# Patient Record
Sex: Female | Born: 1975 | Hispanic: Yes | Marital: Married | Smoking: Never smoker
Health system: Southern US, Community
[De-identification: ages and names within clinical notes are randomized; demographics above are authoritative.]

## PROBLEM LIST (undated history)

## (undated) DIAGNOSIS — R739 Hyperglycemia, unspecified: Secondary | ICD-10-CM

## (undated) DIAGNOSIS — E785 Hyperlipidemia, unspecified: Secondary | ICD-10-CM

## (undated) HISTORY — DX: Hyperlipidemia, unspecified: E78.5

## (undated) HISTORY — DX: Hyperglycemia, unspecified: R73.9

---

## 1999-11-19 HISTORY — PX: TUBAL LIGATION: SHX77

## 2015-11-19 DIAGNOSIS — E785 Hyperlipidemia, unspecified: Secondary | ICD-10-CM

## 2015-11-19 HISTORY — DX: Hyperlipidemia, unspecified: E78.5

## 2017-11-18 DIAGNOSIS — R739 Hyperglycemia, unspecified: Secondary | ICD-10-CM

## 2017-11-18 HISTORY — DX: Hyperglycemia, unspecified: R73.9

## 2018-03-27 ENCOUNTER — Ambulatory Visit: Payer: Self-pay | Admitting: Internal Medicine

## 2018-05-11 ENCOUNTER — Ambulatory Visit: Payer: Self-pay | Admitting: Internal Medicine

## 2018-05-11 ENCOUNTER — Encounter: Payer: Self-pay | Admitting: Internal Medicine

## 2018-05-11 VITALS — BP 122/84 | HR 68 | Resp 12 | Ht 60.0 in | Wt 136.0 lb

## 2018-05-11 DIAGNOSIS — R631 Polydipsia: Secondary | ICD-10-CM

## 2018-05-11 DIAGNOSIS — G5603 Carpal tunnel syndrome, bilateral upper limbs: Secondary | ICD-10-CM

## 2018-05-11 DIAGNOSIS — R358 Other polyuria: Secondary | ICD-10-CM

## 2018-05-11 DIAGNOSIS — E785 Hyperlipidemia, unspecified: Secondary | ICD-10-CM

## 2018-05-11 DIAGNOSIS — R739 Hyperglycemia, unspecified: Secondary | ICD-10-CM

## 2018-05-11 DIAGNOSIS — R3589 Other polyuria: Secondary | ICD-10-CM

## 2018-05-11 LAB — POCT URINALYSIS DIPSTICK
Bilirubin, UA: NEGATIVE
Glucose, UA: NEGATIVE
KETONES UA: NEGATIVE
LEUKOCYTES UA: NEGATIVE
NITRITE UA: POSITIVE
PH UA: 6.5 (ref 5.0–8.0)
PROTEIN UA: POSITIVE — AB
Spec Grav, UA: 1.015 (ref 1.010–1.025)
UROBILINOGEN UA: 0.2 U/dL

## 2018-05-11 LAB — GLUCOSE, POCT (MANUAL RESULT ENTRY): POC GLUCOSE: 117 mg/dL — AB (ref 70–99)

## 2018-05-11 MED ORDER — CIPROFLOXACIN HCL 500 MG PO TABS: ORAL_TABLET | ORAL | 0 refills | Status: DC

## 2018-05-11 NOTE — Patient Instructions (Signed)
Cock up splints for both hands to be worn every night with sleep. Ibuprofen 200 mg 2-3 pastillas dos veces al dia con comida para 14 days.

## 2018-05-11 NOTE — Progress Notes (Signed)
Subjective:    Patient ID: Virginia Hammond, female    DOB: 10-30-1976, 42 y.o.   MRN: [id number]  HPI   Here to establish  1.  Finger numbness:  Does wake her from sleep.  Starts on the thumb side and then moves toward ulnar side of hand with finger involvement.  States this has been going on for 3 months.  Generally occurs 3 times weekly.  Lasts about 1 hour, but does state she can shake her hands out and will temporarily resolve. 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  Does describe repetitive wrist movement with her job.   Also, notes this when brushing or doing her hair.   At times the discomfort and numbness seems to start in her lateral shoulders and radiates down to hands.   She is right handed.   Denies associated neck and trap area pain or symptoms.   She does sometimes have a headache surrounding her left eye, but this is not all the time.  2. For past 2 weeks, gets really thirsty and saliva gets really thick and sticky and her mouth is dry.  She feels she has to urinate even if she does not drink much water. She has to get up 5 times to urinate at night.  Has to get up every 10 minutes to urinate until about 3 a.m and then the feeling resolves and she is able to sleep the rest of the night/morning. No burning on urination. No hematuria. No fever. Her maternal grandfather has a history of DM.   No history of GDM or elevated blood sugars in the past. She believes she is drinking 11 eight ounce bottles of water. She has gained some weight in recent months.  Sugar here was 117 fingerstick just after 2 pm.  Last meal was 11:30 a.m.  No outpatient medications have been marked as taking for the 05/11/18 encounter (Office Visit) with Julieanne MansonMulberry, Flavio Lindroth, MD.    No Known Allergies   Past Medical History:  Diagnosis Date  . [place]   Past Surgical History:  Procedure  Laterality Date  . CESAREAN SECTION  1994, 1995. 2001   First was for a twin birth  . TUBAL LIGATION  2001   with last C section   Family History  Problem Relation Age of Onset  . Muscular dystrophy Brother        Donalee CitrinBecker MD  . Other Brother        cystercercosis  . Muscular dystrophy Brother        Donalee CitrinBecker MD  . Muscular dystrophy Brother        Donalee CitrinBecker MD   Social History   Socioeconomic History  . Marital status: Married    Spouse name: Garnette ScheuermannSalvador Cruz  . Number of children: 4  . Years of education: 2012  . Highest education level: Not on file  Occupational History  . Not on file  Social Needs  . Financial resource strain: Not on file  . Food insecurity:    Worry: Not on file    Inability: Not on file  . Transportation needs:    Medical: Not on file    Non-medical: Not on file  Tobacco Use  . Smoking status: Never Smoker  . Smokeless tobacco: Never Used  Substance and Sexual Activity  . Alcohol use: Never    Frequency: Never  . Drug use: Never  .  Sexual activity: Yes  Lifestyle  . Physical activity:    Days per week: Not on file    Minutes per session: Not on file  . Stress: Not on file  Relationships  . Social connections:    Talks on phone: Not on file    Gets together: Not on file    Attends religious service: Not on file    Active member of club or organization: Not on file    Attends meetings of clubs or organizations: Not on file    Relationship status: Not on file  . Intimate partner violence:    Fear of current or ex partner: Not on file    Emotionally abused: Not on file    Physically abused: Not on file    Forced sexual activity: Not on file  Other Topics Concern  . Not on file  Social History Narrative   [place]   Lives at home with husband, son, oldest daughter and her daughter's husband and their 2 children      Review of Systems     Objective:   Physical Exam  NAD HEENT:  PERRL, EOMI, TMs pearly gray, throat without  injection.  MMM Neck:  Supple, No adenopathy, no thyromegaly Chest:  CTA CV:  RRR with normal S1 and S2, No S3, S4 or murmur.  Radial and DP pulses normal and equal Abd:  S, NT, No HSM or mass, + BS. No flank tenderness  MS:  Hands:  Tender PIPs with bony hypertrophic changes.  Negative Tinels at median nerve, volar wrist, but + Phalens bilaterally with wrist flexion.        Assessment & Plan:  1.  Mild hyperglycemia on fingerstick:  CMP, A1C.    2.  Possible polydipsia/polyuria vs urinary frequency:  + nitrite on UA.  Start Cipro 500 mg twice daily for 7 days.   Urine for culture.  CBC A1C as above to evaluate for DM as cause of urinary complaints as well.  3.  Hyperlipidemia:  Fasting today.  Check FLP  4.  Bilateral Carpal Tunnel Syndrome:  Cock up splints nightly and Ibuprofen twice daily for 2 weeks.  Follow up in 2 months for CPE.

## 2018-05-12 ENCOUNTER — Telehealth: Payer: Self-pay | Admitting: Licensed Clinical Social Worker

## 2018-05-12 LAB — CBC WITH DIFFERENTIAL/PLATELET
BASOS ABS: 0 10*3/uL (ref 0.0–0.2)
Basos: 0 %
EOS (ABSOLUTE): 0.2 10*3/uL (ref 0.0–0.4)
Eos: 4 %
Hematocrit: 41.5 % (ref 34.0–46.6)
Hemoglobin: 13.6 g/dL (ref 11.1–15.9)
IMMATURE GRANS (ABS): 0 10*3/uL (ref 0.0–0.1)
Immature Granulocytes: 0 %
LYMPHS ABS: 1.7 10*3/uL (ref 0.7–3.1)
LYMPHS: 34 %
MCH: 30.8 pg (ref 26.6–33.0)
MCHC: 32.8 g/dL (ref 31.5–35.7)
MCV: 94 fL (ref 79–97)
Monocytes Absolute: 0.4 10*3/uL (ref 0.1–0.9)
Monocytes: 8 %
NEUTROS ABS: 2.8 10*3/uL (ref 1.4–7.0)
Neutrophils: 54 %
PLATELETS: 258 10*3/uL (ref 150–450)
RBC: 4.42 x10E6/uL (ref 3.77–5.28)
RDW: 13.8 % (ref 12.3–15.4)
WBC: 5.2 10*3/uL (ref 3.4–10.8)

## 2018-05-12 LAB — HGB A1C W/O EAG: Hgb A1c MFr Bld: 5.6 % (ref 4.8–5.6)

## 2018-05-12 LAB — COMPREHENSIVE METABOLIC PANEL
ALBUMIN: 4.1 g/dL (ref 3.5–5.5)
ALK PHOS: 58 IU/L (ref 39–117)
ALT: 15 IU/L (ref 0–32)
AST: 17 IU/L (ref 0–40)
Albumin/Globulin Ratio: 1.5 (ref 1.2–2.2)
BUN / CREAT RATIO: 17 (ref 9–23)
BUN: 11 mg/dL (ref 6–24)
CHLORIDE: 104 mmol/L (ref 96–106)
CO2: 24 mmol/L (ref 20–29)
CREATININE: 0.65 mg/dL (ref 0.57–1.00)
Calcium: 8.7 mg/dL (ref 8.7–10.2)
GFR calc Af Amer: 126 mL/min/{1.73_m2} (ref >59)
GFR calc non Af Amer: 109 mL/min/{1.73_m2} (ref >59)
GLUCOSE: 85 mg/dL (ref 65–99)
Globulin, Total: 2.7 g/dL (ref 1.5–4.5)
POTASSIUM: 4.1 mmol/L (ref 3.5–5.2)
SODIUM: 139 mmol/L (ref 134–144)
Total Protein: 6.8 g/dL (ref 6.0–8.5)

## 2018-05-12 NOTE — Telephone Encounter (Signed)
LCSW called patient in order to introduce self and provide information about social work services available at the clinic. Pt reported that she did not have any needs at this time.

## 2018-05-13 LAB — URINE CULTURE

## 2018-05-20 ENCOUNTER — Other Ambulatory Visit: Payer: Self-pay

## 2018-05-20 DIAGNOSIS — R3589 Other polyuria: Secondary | ICD-10-CM

## 2018-05-20 DIAGNOSIS — R358 Other polyuria: Secondary | ICD-10-CM

## 2018-05-20 LAB — POCT URINALYSIS DIPSTICK
BILIRUBIN UA: NEGATIVE
Glucose, UA: NEGATIVE
KETONES UA: NEGATIVE
Leukocytes, UA: NEGATIVE
Nitrite, UA: NEGATIVE
PH UA: 5 (ref 5.0–8.0)
Protein, UA: NEGATIVE
Spec Grav, UA: 1.01 (ref 1.010–1.025)
UROBILINOGEN UA: 0.2 U/dL

## 2018-07-13 ENCOUNTER — Encounter: Payer: Self-pay | Admitting: Internal Medicine

## 2018-07-13 ENCOUNTER — Ambulatory Visit: Payer: Self-pay | Admitting: Internal Medicine

## 2018-07-13 VITALS — BP 128/80 | HR 70 | Resp 12 | Ht 60.0 in | Wt 138.0 lb

## 2018-07-13 DIAGNOSIS — Z0000 Encounter for general adult medical examination without abnormal findings: Secondary | ICD-10-CM

## 2018-07-13 DIAGNOSIS — Z23 Encounter for immunization: Secondary | ICD-10-CM

## 2018-07-13 DIAGNOSIS — Z1231 Encounter for screening mammogram for malignant neoplasm of breast: Secondary | ICD-10-CM

## 2018-07-13 DIAGNOSIS — Z124 Encounter for screening for malignant neoplasm of cervix: Secondary | ICD-10-CM

## 2018-07-13 DIAGNOSIS — Z1239 Encounter for other screening for malignant neoplasm of breast: Secondary | ICD-10-CM

## 2018-07-13 DIAGNOSIS — E785 Hyperlipidemia, unspecified: Secondary | ICD-10-CM

## 2018-07-13 DIAGNOSIS — R739 Hyperglycemia, unspecified: Secondary | ICD-10-CM

## 2018-07-13 DIAGNOSIS — H9192 Unspecified hearing loss, left ear: Secondary | ICD-10-CM

## 2018-07-13 NOTE — Progress Notes (Signed)
Subjective:    Patient ID: Virginia Hammond, female    DOB: Jun 08, 1976, 42 y.o.   MRN: [id number]  HPI   CPE with pap  1.  Pap:  Last pap in [place] 3 years ago.  Normal.  Always normal.  No family history of cervical cancer.  2.  Mammogram:  Has never had a mammogram.  No family history of breast cancer.    3.  Osteoprevention:  1-2 servings of milk or cheese daily.  Walks daily for about 20 minutes.  No family history of osteoporosis.    4.  Guaiac Cards:  Never.    5.  Colonoscopy:  Never.  No family history of colon cancer.    6.  Immunizations:  Thinks she had a Td in [place].    7.  Glucose/Cholesterol:  Elevated glucose recently, but A1C in normal range at 5.6%. She is fasting today.  She has had cholesterol checked in past and told hers was high.    Recent UTI:  Treated with Cipro.  Her urinary complaints resolved with treatment.    No outpatient medications have been marked as taking for the 07/13/18 encounter (Office Visit) with Julieanne Manson, MD.    No Known Allergies   Past Medical History:  Diagnosis Date  . Hyperglycemia 2019   A1C 5.6% 05/2018  . Hyperlipidemia 2017   [facility]    Past Surgical History:  Procedure Laterality Date  . CESAREAN SECTION  1994, 1995. 2001   First was for a twin birth  . TUBAL LIGATION  2001   with last C section    Family History  Problem Relation Age of Onset  . Muscular dystrophy Brother        Donalee Citrin MD  . Other Brother        cystercercosis  . Muscular dystrophy Brother        Donalee Citrin MD  . Muscular dystrophy Brother        Donalee Citrin MD   Social History   Socioeconomic History  . Marital status: Married    Spouse name: Garnette Scheuermann  . Number of children: 4  . Years of education: 34  . Highest education level: Not on file  Occupational History  . Not on file  Social Needs  . Financial resource strain: Not on file  . Food insecurity:    Worry: Not on file    Inability: Not on file  .  Transportation needs:    Medical: Not on file    Non-medical: Not on file  Tobacco Use  . Smoking status: Never Smoker  . Smokeless tobacco: Never Used  Substance and Sexual Activity  . Alcohol use: Never    Frequency: Never  . Drug use: Never  . Sexual activity: Yes    Birth control/protection: Surgical  Lifestyle  . Physical activity:    Days per week: Not on file    Minutes per session: Not on file  . Stress: Not on file  Relationships  . Social connections:    Talks on phone: Not on file    Gets together: Not on file    Attends religious service: Not on file    Active member of club or organization: Not on file    Attends meetings of clubs or organizations: Not on file    Relationship status: Not on file  . Intimate partner violence:    Fear of current or ex partner: No    Emotionally abused: No  Physically abused: No    Forced sexual activity: No  Other Topics Concern  . Not on file  Social History Narrative   [place]   Lives at home with husband, son, oldest daughter and her daughter's husband and their 2 children      Review of Systems  Constitutional: Negative for appetite change, fatigue and fever.  HENT: Positive for dental problem (Pain in left lower posterior molar) and hearing loss (Thinks she has trouble hearing out of her left ear.  No history of frequent ear infections.  Feels her hearing has decreased in past 6 months.). Negative for rhinorrhea, sneezing and sore throat.   Eyes: Negative for visual disturbance.  Respiratory: Negative for shortness of breath.   Cardiovascular: Negative for chest pain, palpitations and leg swelling.  Gastrointestinal: Positive for abdominal pain (points to umbilical area.  Has in the mornings sometimes.  Has had for about 1 week.  Squeezing internal pain.  Lasts for seconds and then gone.  Happens maybe twice in a day.). Negative for blood in stool (no melena), constipation and diarrhea.  Genitourinary:  Negative for dysuria, frequency, menstrual problem and vaginal discharge.  Musculoskeletal: Negative for arthralgias.  Skin: Negative for rash.  Neurological: Negative for weakness and numbness.  Psychiatric/Behavioral: Negative for dysphoric mood. The patient is not nervous/anxious.        Objective:   Physical Exam  Constitutional: She is oriented to person, place, and time. She appears well-developed and well-nourished.  HENT:  Head: Normocephalic and atraumatic.  Right Ear: Tympanic membrane, external ear and ear canal normal.  Left Ear: Tympanic membrane, external ear and ear canal normal.  Nose: Nose normal.  Mouth/Throat: Uvula is midline, oropharynx is clear and moist and mucous membranes are normal.  Eyes: Pupils are equal, round, and reactive to light. Conjunctivae and EOM are normal.  Discs sharp bilaterally.  Neck: Normal range of motion and full passive range of motion without pain. Neck supple. No thyromegaly present.  Cardiovascular: Normal rate, regular rhythm, S1 normal and S2 normal. Exam reveals no S3, no S4 and no friction rub.  No murmur heard. No carotid bruits.  Carotid, radial, femoral, DP and PT pulses normal and equal.   Pulmonary/Chest: Effort normal and breath sounds normal. Right breast exhibits no inverted nipple, no mass, no nipple discharge, no skin change and no tenderness. Left breast exhibits no inverted nipple, no mass, no nipple discharge, no skin change and no tenderness.  Abdominal: Soft. Bowel sounds are normal. She exhibits no mass. There is no hepatosplenomegaly. There is no tenderness. No hernia.  Midline incisional scar to umbilicus.  Genitourinary: Rectal exam shows no mass and guaiac negative stool.  Genitourinary Comments: Normal external female genitalia No vaginal mucosal lesions. Cervix with clear cystic lesion at about 5 O'clock involving cervix from os almost to outer edge of face.  Has what almost looks like a venous spider within  it. No CMT. No uterine or adnexal mass or tenderness.   Musculoskeletal: Normal range of motion.  Lymphadenopathy:       Head (right side): No submental and no submandibular adenopathy present.       Head (left side): No submental and no submandibular adenopathy present.    She has no cervical adenopathy.    She has no axillary adenopathy.       Right: No inguinal and no supraclavicular adenopathy present.       Left: No inguinal and no supraclavicular adenopathy present.  Neurological:  She is alert and oriented to person, place, and time. She has normal strength and normal reflexes. No cranial nerve deficit or sensory deficit. She exhibits normal muscle tone. Coordination and gait normal.  Skin: Skin is warm. Capillary refill takes less than 2 seconds. No rash noted.  Psychiatric: She has a normal mood and affect. Her speech is normal and behavior is normal. Judgment and thought content normal. Cognition and memory are normal.            Assessment & Plan:  1.  CPE with pap Schedule Mammogram, baseline:  [facility] Recommend calling for influenza clinic in 2 weeks. Guaiac cards x 3, to return in 2 weeks.  2.  Hyperlipidemia:  FLP  3.  Hyperglycemia:  With high normal A1C.  Encouraged working on lifestyle changes including diet and physical activity.  4.  Decreased hearing, left ear.  Referral to [facility]

## 2018-07-13 NOTE — Patient Instructions (Signed)

## 2018-07-14 LAB — LIPID PANEL W/O CHOL/HDL RATIO
Cholesterol, Total: 226 mg/dL — ABNORMAL HIGH (ref 100–199)
HDL: 52 mg/dL (ref >39)
LDL CALC: 142 mg/dL — AB (ref 0–99)
Triglycerides: 160 mg/dL — ABNORMAL HIGH (ref 0–149)
VLDL CHOLESTEROL CAL: 32 mg/dL (ref 5–40)

## 2018-07-15 LAB — CYTOLOGY - PAP

## 2018-08-03 ENCOUNTER — Other Ambulatory Visit (INDEPENDENT_AMBULATORY_CARE_PROVIDER_SITE_OTHER): Payer: Self-pay

## 2018-08-03 DIAGNOSIS — Z1211 Encounter for screening for malignant neoplasm of colon: Secondary | ICD-10-CM

## 2018-08-03 LAB — POC HEMOCCULT BLD/STL (HOME/3-CARD/SCREEN)
FECAL OCCULT BLD: NEGATIVE
FECAL OCCULT BLD: NEGATIVE
Fecal Occult Blood, POC: NEGATIVE

## 2018-08-05 ENCOUNTER — Telehealth: Payer: Self-pay | Admitting: Internal Medicine

## 2018-08-05 NOTE — Telephone Encounter (Signed)
To Dr. Mulberry for approval 

## 2018-08-05 NOTE — Telephone Encounter (Signed)
Patient called requesting a dental referral; patient stated has current [employer] now. Please advise.

## 2018-11-04 ENCOUNTER — Other Ambulatory Visit (HOSPITAL_COMMUNITY): Payer: Self-pay | Admitting: *Deleted

## 2018-11-04 ENCOUNTER — Ambulatory Visit: Payer: Self-pay | Admitting: Internal Medicine

## 2018-11-04 ENCOUNTER — Encounter: Payer: Self-pay | Admitting: Internal Medicine

## 2018-11-04 VITALS — BP 122/80 | HR 72 | Resp 12 | Ht 60.0 in | Wt 136.0 lb

## 2018-11-04 DIAGNOSIS — B3731 Acute candidiasis of vulva and vagina: Secondary | ICD-10-CM

## 2018-11-04 DIAGNOSIS — Z1231 Encounter for screening mammogram for malignant neoplasm of breast: Secondary | ICD-10-CM

## 2018-11-04 DIAGNOSIS — N889 Noninflammatory disorder of cervix uteri, unspecified: Secondary | ICD-10-CM

## 2018-11-04 DIAGNOSIS — B373 Candidiasis of vulva and vagina: Secondary | ICD-10-CM

## 2018-11-04 LAB — POCT WET PREP WITH KOH
Clue Cells Wet Prep HPF POC: NEGATIVE
KOH PREP POC: POSITIVE — AB
Trichomonas, UA: NEGATIVE
Yeast Wet Prep HPF POC: POSITIVE

## 2018-11-04 MED ORDER — FLUCONAZOLE 150 MG PO TABS: ORAL_TABLET | ORAL | 0 refills | Status: DC

## 2018-11-04 NOTE — Progress Notes (Signed)
   Subjective:    Patient ID: Marrianne Moodatricia Huerta Martinez, female    DOB: 11/16/1976, 42 y.o.   MRN: [id number]  HPI   Here for revisualization of cervix as had odd looking cystic lesion that also appeared somewhat vascular at about 5 O'Clock in August.  Pap returned normal.    After exam, patient does admit she has had some vaginal itching.  Hyperlipidemia:  Not really working on lifestyle changes yet.  Plans to get started after holidays  No outpatient medications have been marked as taking for the 11/04/18 encounter (Office Visit) with Julieanne MansonMulberry, Lucifer Soja, MD.   No Known Allergies    Review of Systems     Objective:   Physical Exam  NAD GU:  Raised cystic lesion with what appears to be vasculature now involving os and radiating out to almost edge of face of cervix.  Friable. No other obvious inflammation of vaginal mucosa or cervix.  Some beige vaginal discharge.  No odor.       Assessment & Plan:  1.  Cervical lesion:  [facility]  They will see her.  Attempted to send referral, but can only send to [place] site. Made over the phone.  2.  Yeast vaginitis:  Fluconazole 150 mg daily for 2 days.  Check GC/Chlamydia as well.

## 2018-11-07 LAB — GC/CHLAMYDIA PROBE AMP
CHLAMYDIA, DNA PROBE: NEGATIVE
Neisseria gonorrhoeae by PCR: NEGATIVE

## 2018-11-19 ENCOUNTER — Encounter (HOSPITAL_COMMUNITY): Payer: Self-pay

## 2018-11-19 ENCOUNTER — Ambulatory Visit
Admission: RE | Admit: 2018-11-19 | Discharge: 2018-11-19 | Disposition: A | Discharge status: Home / Self Care | Payer: Self-pay | Source: Ambulatory Visit | Attending: Obstetrics and Gynecology | Admitting: Obstetrics and Gynecology

## 2018-11-19 ENCOUNTER — Ambulatory Visit (HOSPITAL_COMMUNITY)
Admission: RE | Admit: 2018-11-19 | Discharge: 2018-11-19 | Disposition: A | Discharge status: Home / Self Care | Payer: Self-pay | Source: Ambulatory Visit | Attending: Obstetrics and Gynecology | Admitting: Obstetrics and Gynecology

## 2018-11-19 VITALS — BP 114/70 | Wt 139.0 lb

## 2018-11-19 DIAGNOSIS — N888 Other specified noninflammatory disorders of cervix uteri: Secondary | ICD-10-CM

## 2018-11-19 DIAGNOSIS — Z1231 Encounter for screening mammogram for malignant neoplasm of breast: Secondary | ICD-10-CM

## 2018-11-19 DIAGNOSIS — Z01419 Encounter for gynecological examination (general) (routine) without abnormal findings: Secondary | ICD-10-CM

## 2018-11-19 NOTE — Patient Instructions (Signed)
Explained breast self Virginia Hammond. Let patient know that follow-up for today's Pap smear will be based on the result. Referred patient to the [facility] for a screening mammogram. Appointment scheduled for Thursday, November 19, 2018 at 1320. Patient aware of appointment and will be there. Let patient know the Breast Center will follow up with her within the next couple weeks with results of mammogram by letter or phone. Virginia Hammond verbalized understanding.  Rashanda Magloire, Kathaleen Maser, RN 12:18 PM

## 2018-11-19 NOTE — Progress Notes (Signed)
Patient referred to [facility] by the [facility] due to having a cervical mass.  Pap Smear: Pap smear completed today due to having a cervical mass. Last Pap smear was 07/13/2018 at [facility] and normal. Per patient has no history of an abnormal Pap smear. Last Pap smear result is in Epic.  Physical exam: Breasts Breasts symmetrical. No skin abnormalities bilateral breasts. No nipple retraction bilateral breasts. No nipple discharge bilateral breasts. No lymphadenopathy. No lumps palpated bilateral breasts. No complaints of pain or tenderness on exam. Referred patient to the [facility] of [facility] for a screening mammogram. Appointment scheduled for Thursday, November 19, 2018 at 1320.        Pelvic/Bimanual   Ext Genitalia No lesions, no swelling and no discharge observed on external genitalia.         Vagina Vagina pink and normal texture. No lesions or discharge observed in vagina.          Cervix Cervix is present. Cervix pink and of normal texture. Mass observed on cervix between 4-7 o'clock. Cervix friable. No discharge observed.     Uterus Uterus is present and palpable. Uterus in normal position and normal size.        Adnexae Bilateral ovaries present and palpable. No tenderness on palpation.         Rectovaginal No rectal exam completed today since patient had no rectal complaints. No skin abnormalities observed on exam.    Smoking History: Patient has never smoked.  Patient Navigation: Patient education provided. Access to services provided for patient through [facility] program. Spanish interpreter provided.   Colorectal Cancer Screening: Per patient has never had a colonoscopy completed. Patient completed the FOBT stools cards 08/03/2018 that were negative. No complaints today.   Breast and Cervical Cancer Risk Assessment: Patient has no family history of breast cancer, known genetic mutations, or radiation treatment to the chest before age 25. Patient has  no history of cervical dysplasia, immunocompromised, or DES exposure in-utero.  Risk Assessment    Risk Scores      11/19/2018   Last edited by: Lynnell Dike, LPN   5-year risk: 0.4 %   Lifetime risk: 5 %         Used Spanish interpreter Natale Lay from [place].

## 2018-11-20 ENCOUNTER — Encounter (HOSPITAL_COMMUNITY): Payer: Self-pay | Admitting: *Deleted

## 2018-11-24 LAB — CYTOLOGY - PAP
Diagnosis: NEGATIVE
HPV: NOT DETECTED

## 2018-12-15 ENCOUNTER — Encounter (HOSPITAL_COMMUNITY): Payer: Self-pay | Admitting: *Deleted

## 2018-12-15 NOTE — Progress Notes (Signed)
Letter mailed to patient with negative pap smear results. HPV was negative. Next pap smear due in five years. 

## 2019-01-08 ENCOUNTER — Encounter: Payer: PRIVATE HEALTH INSURANCE | Admitting: Family Medicine

## 2019-01-15 ENCOUNTER — Ambulatory Visit: Payer: Self-pay | Admitting: Internal Medicine

## 2019-01-15 ENCOUNTER — Ambulatory Visit (INDEPENDENT_AMBULATORY_CARE_PROVIDER_SITE_OTHER): Payer: Self-pay | Admitting: Obstetrics & Gynecology

## 2019-01-15 ENCOUNTER — Other Ambulatory Visit (HOSPITAL_COMMUNITY)
Admission: RE | Admit: 2019-01-15 | Discharge: 2019-01-15 | Disposition: A | Discharge status: Home / Self Care | Payer: Self-pay | Source: Ambulatory Visit | Attending: Obstetrics & Gynecology | Admitting: Obstetrics & Gynecology

## 2019-01-15 ENCOUNTER — Encounter: Payer: Self-pay | Admitting: Obstetrics & Gynecology

## 2019-01-15 ENCOUNTER — Encounter: Payer: Self-pay | Admitting: Internal Medicine

## 2019-01-15 VITALS — BP 112/72 | HR 76 | Resp 12 | Ht 60.0 in | Wt 132.0 lb

## 2019-01-15 VITALS — BP 116/54 | HR 63 | Wt 133.0 lb

## 2019-01-15 DIAGNOSIS — R739 Hyperglycemia, unspecified: Secondary | ICD-10-CM

## 2019-01-15 DIAGNOSIS — N889 Noninflammatory disorder of cervix uteri, unspecified: Secondary | ICD-10-CM

## 2019-01-15 DIAGNOSIS — E785 Hyperlipidemia, unspecified: Secondary | ICD-10-CM

## 2019-01-15 DIAGNOSIS — N841 Polyp of cervix uteri: Secondary | ICD-10-CM

## 2019-01-15 NOTE — Progress Notes (Signed)
    Subjective:    Patient ID: Virginia Hammond, female   DOB: 12-16-1975, 43 y.o.   MRN: [id number]   HPI   Elevated blood sugar last year with high normal A1C at 5.6% and elevated cholesterol with high trigs and LDL.  HDL at goal.   Lipid Panel     Component Value Date/Time   CHOL 226 (H) 07/13/2018 1325   TRIG 160 (H) 07/13/2018 1325   HDL 52 07/13/2018 1325   LDLCALC 142 (H) 07/13/2018 1325   She is drinking veggie and fruit smoothies, but filtering out the fiber before drinking.  Also eating a lot of chicken bone broth.  States this is some 20 day diet thing. Discussed long term lifestyle changes instead  No outpatient medications have been marked as taking for the 01/15/19 encounter (Office Visit) with Julieanne Manson, MD.   No Known Allergies   Review of Systems    Objective:   BP 112/72 (BP Location: Left Arm, Patient Position: Sitting, Cuff Size: Normal)   Pulse 76   Resp 12   Ht 5' (1.524 m)   Wt 132 lb (59.9 kg)   LMP 01/01/2019 (Exact Date)   BMI 25.78 kg/m   Physical Exam   NAD Lungs:  CTA CV:  RRR without murmur or rub.  Radial and DP pulses normal and equal.    Assessment & Plan   Hyperglycemia and hyperlipidemia:  Encouraged long term lifestyle changes with diet and physical activity instead of short term odd dietary changes she will likely not maintain. To continue to work on these issues and return in 3 months for repeat fasting labs:  FLP, BMP  Cervical lesion:  Had biopsy with gynecology today.  Path pending.

## 2019-01-15 NOTE — Progress Notes (Signed)
   Subjective:    Patient ID: Virginia Hammond, female    DOB: [birth year], 43 y.o.   MRN: [id number]  HPI 43 yo married Hispanic P4 here for a referral as a possible cervical lesion was seen at the time of her pap smear done at [facility]. The pap was normal.   Review of Systems     Objective:   Physical Exam Breathing, conversing, and ambulating normally Well nourished, well hydrated Latina, no apparent distress  Live interpretor present for exam 5 mm cystic area seen on the ectocervix at the 5 o'clock position. When I pushed on it with a swab, it enlarged, looking almost like a bruise. I biopsied it and the base was rather bloody.  I used Monsel's solution to achieve hemostasis She tolerated the procedure well.    Assessment & Plan:  Cervical lesion- await pathology Someone will call her with the results

## 2019-01-15 NOTE — Patient Instructions (Signed)

## 2019-02-03 ENCOUNTER — Telehealth: Payer: Self-pay

## 2019-02-03 NOTE — Telephone Encounter (Signed)
Received call from patient concerning surgical biopsy. Surgical showed a endocervical polyp. Patient has been informed.

## 2019-02-12 ENCOUNTER — Encounter: Payer: Self-pay | Admitting: *Deleted

## 2019-04-09 ENCOUNTER — Other Ambulatory Visit: Payer: Self-pay

## 2019-04-09 DIAGNOSIS — R739 Hyperglycemia, unspecified: Secondary | ICD-10-CM

## 2019-04-09 DIAGNOSIS — E785 Hyperlipidemia, unspecified: Secondary | ICD-10-CM

## 2019-04-10 LAB — BASIC METABOLIC PANEL
BUN/Creatinine Ratio: 15 (ref 9–23)
BUN: 9 mg/dL (ref 6–24)
CO2: 23 mmol/L (ref 20–29)
Calcium: 8.8 mg/dL (ref 8.7–10.2)
Chloride: 108 mmol/L — ABNORMAL HIGH (ref 96–106)
Creatinine, Ser: 0.61 mg/dL (ref 0.57–1.00)
GFR calc Af Amer: 128 mL/min/{1.73_m2} (ref >59)
GFR calc non Af Amer: 111 mL/min/{1.73_m2} (ref >59)
Glucose: 89 mg/dL (ref 65–99)
Potassium: 4.5 mmol/L (ref 3.5–5.2)
Sodium: 141 mmol/L (ref 134–144)

## 2019-04-10 LAB — LIPID PANEL W/O CHOL/HDL RATIO
Cholesterol, Total: 197 mg/dL (ref 100–199)
HDL: 53 mg/dL (ref >39)
LDL Calculated: 127 mg/dL — ABNORMAL HIGH (ref 0–99)
Triglycerides: 86 mg/dL (ref 0–149)
VLDL Cholesterol Cal: 17 mg/dL (ref 5–40)

## 2019-07-06 ENCOUNTER — Telehealth: Payer: Self-pay | Admitting: Internal Medicine

## 2019-07-06 NOTE — Telephone Encounter (Signed)
Spoke with patient and Acute appointment scheduled for 07/07/19 @12 :00pm.

## 2019-07-06 NOTE — Telephone Encounter (Signed)
Discussed with Dr. Amil Amen - Per Dr. please call patient to schedule for an acute appointment
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
## 2019-07-07 ENCOUNTER — Other Ambulatory Visit: Payer: Self-pay

## 2019-07-07 ENCOUNTER — Ambulatory Visit: Payer: Self-pay | Admitting: Internal Medicine

## 2019-07-07 ENCOUNTER — Encounter: Payer: Self-pay | Admitting: Internal Medicine

## 2019-07-07 VITALS — BP 110/70 | HR 60 | Temp 98.6°F | Resp 12 | Ht 60.0 in | Wt 127.5 lb

## 2019-07-07 DIAGNOSIS — M542 Cervicalgia: Secondary | ICD-10-CM
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
## 2019-07-07 NOTE — Patient Instructions (Signed)
Call if your neck and head pain do not continue to improve

## 2019-10-29 ENCOUNTER — Other Ambulatory Visit: Payer: Self-pay

## 2019-10-29 ENCOUNTER — Other Ambulatory Visit (INDEPENDENT_AMBULATORY_CARE_PROVIDER_SITE_OTHER): Payer: Self-pay

## 2019-10-29 DIAGNOSIS — R300 Dysuria: Secondary | ICD-10-CM

## 2019-10-29 LAB — POCT URINALYSIS DIPSTICK
Bilirubin, UA: NEGATIVE
Glucose, UA: NEGATIVE
Ketones, UA: NEGATIVE
Nitrite, UA: NEGATIVE
Protein, UA: NEGATIVE
Spec Grav, UA: <=1.005 — AB (ref 1.010–1.025)
Urobilinogen, UA: 0.2 E.U./dL
pH, UA: 7 (ref 5.0–8.0)

## 2019-10-31 LAB — URINE CULTURE

## 2019-11-10 ENCOUNTER — Telehealth: Payer: Self-pay

## 2019-11-10 MED ORDER — CIPROFLOXACIN HCL 500 MG PO TABS: 500.0000 mg | ORAL_TABLET | Freq: Two times a day (BID) | ORAL | 0 refills | Status: AC

## 2019-11-10 NOTE — Telephone Encounter (Signed)
Called patient and apologized for delay in getting urine culture results back. Patient states she is not having any symptoms anymore. States we are going to still call her in Cipro 500 mg 1 twice a day for 3 days. Patient instructed to come back in 1 week for repeat UA. [name] interpreted for this conversation. Patient verbalized understanding and wants Rx sent to [facility] on [address] in [place].

## 2019-11-18 ENCOUNTER — Other Ambulatory Visit (INDEPENDENT_AMBULATORY_CARE_PROVIDER_SITE_OTHER): Payer: Self-pay

## 2019-11-18 DIAGNOSIS — R350 Frequency of micturition: Secondary | ICD-10-CM

## 2019-11-18 LAB — POCT URINALYSIS DIPSTICK
Bilirubin, UA: NEGATIVE
Blood, UA: NEGATIVE
Glucose, UA: NEGATIVE
Ketones, UA: NEGATIVE
Leukocytes, UA: NEGATIVE
Nitrite, UA: NEGATIVE
Protein, UA: NEGATIVE
Spec Grav, UA: 1.02 (ref 1.010–1.025)
Urobilinogen, UA: 0.2 E.U./dL
pH, UA: 5 (ref 5.0–8.0)

## 2020-05-15 ENCOUNTER — Encounter: Payer: Self-pay | Admitting: Internal Medicine

## 2020-07-28 ENCOUNTER — Encounter: Payer: Self-pay | Admitting: Internal Medicine

## 2020-09-18 ENCOUNTER — Ambulatory Visit: Payer: Self-pay | Admitting: Internal Medicine

## 2020-09-18 ENCOUNTER — Encounter: Payer: Self-pay | Admitting: Internal Medicine

## 2020-09-18 VITALS — BP 111/67 | HR 64 | Resp 14 | Ht 60.25 in | Wt 134.0 lb

## 2020-09-18 DIAGNOSIS — K029 Dental caries, unspecified: Secondary | ICD-10-CM | POA: Insufficient documentation

## 2020-09-18 DIAGNOSIS — R739 Hyperglycemia, unspecified: Secondary | ICD-10-CM

## 2020-09-18 DIAGNOSIS — Z23 Encounter for immunization: Secondary | ICD-10-CM

## 2020-09-18 DIAGNOSIS — E785 Hyperlipidemia, unspecified: Secondary | ICD-10-CM

## 2020-09-18 DIAGNOSIS — Z1231 Encounter for screening mammogram for malignant neoplasm of breast: Secondary | ICD-10-CM

## 2020-09-18 DIAGNOSIS — M1990 Unspecified osteoarthritis, unspecified site: Secondary | ICD-10-CM

## 2020-09-18 DIAGNOSIS — Z0000 Encounter for general adult medical examination without abnormal findings: Secondary | ICD-10-CM

## 2020-09-18 NOTE — Progress Notes (Signed)
Subjective:    Patient ID: Virginia Hammond, female   DOB: 27-Jul-1976, 44 y.o.   MRN: [id number]   HPI   CPE without pap  1.  Pap:  Last 11/2018 and normal.    2.  Mammogram:  Last 11/19/2018 and normal.  No family history of breast cancer.  3.  Osteoprevention:  Drinks milk sometimes.  Willing to drink 3-4 cups daily for calcium and Vitamin D.  On feet all day with work.    4.  Guaiac Cards:  Last performed in 2019 and negative for blood.    5.  Colonoscopy:  Never.  No family history of colon cancer.  6.  Immunizations:  Scared of COVID vaccine.  Had COVID early on and worried her continued mild issues with breathing would worsen with the vaccine.  Discussed.  Immunization History  Administered Date(s) Administered   Tdap 11/18/2013, 07/13/2018     7.  Glucose/Cholesterol:  History of mildly elevated glucose with high normal A1C in 2019 at 5.6% and mildly elevated cholesterol.  Last cholesterol in 2020 was acceptable.   Lipid Panel     Component Value Date/Time   CHOL 197 04/09/2019 0911   TRIG 86 04/09/2019 0911   HDL 53 04/09/2019 0911   LDLCALC 127 (H) 04/09/2019 0911   LABVLDL 17 04/09/2019 0911     No outpatient medications have been marked as taking for the 09/18/20 encounter (Office Visit) with Julieanne Manson, MD.   No Known Allergies  Past Medical History:  Diagnosis Date   Hyperglycemia 2019   A1C 5.6% 05/2018   Hyperlipidemia 2017   Marcy Panning   Past Surgical History:  Procedure Laterality Date   CESAREAN SECTION  1994, 1995. 2001   First was for a twin birth   TUBAL LIGATION  2001   with last C section   Family History  Problem Relation Age of Onset   Muscular dystrophy Brother        Donalee Citrin MD   Other Brother        cystercercosis   Muscular dystrophy Brother        Donalee Citrin MD   Muscular dystrophy Brother        Donalee Citrin MD   Diabetes Maternal Grandfather    Hypertension Maternal Grandfather    Social History    Socioeconomic History   Marital status: Married    Spouse name: Garnette Scheuermann   Number of children: 4   Years of education: 12   Highest education level: Not on file  Occupational History   Occupation: Medical illustrator exteriors  Tobacco Use   Smoking status: Never   Smokeless tobacco: Never  Vaping Use   Vaping Use: Never used  Substance and Sexual Activity   Alcohol use: Never   Drug use: Never   Sexual activity: Yes    Birth control/protection: Surgical  Other Topics Concern   Not on file  Social History Narrative   [place]   Lives at home with husband, son, oldest daughter and her daughter's husband and their 2 children   Social Determinants of Health   Financial Resource Strain: Low Risk    Difficulty of Paying Living Expenses: Not very hard  Food Insecurity: Not on file  Transportation Needs: No Transportation Needs   Lack of Transportation (Medical): No   Lack of Transportation (Non-Medical): No  Physical Activity: Not on file  Stress: Not on file  Social Connections: Not on file  Intimate Partner Violence:  Not At Risk   Fear of Current or Ex-Partner: No   Emotionally Abused: No   Physically Abused: No   Sexually Abused: No     Review of Systems  Constitutional:  Negative for appetite change, fatigue and fever.  HENT:  Positive for dental problem ([place]). Negative for hearing loss, rhinorrhea and sore throat.   Eyes:  Positive for redness (Left nasal eye.  Itches at times.) and visual disturbance (Needing to hold things way out to read.  ).  Respiratory:  Positive for shortness of breath (Mild, with deep breathing since COVID diagnosis.). Negative for cough.   Cardiovascular:  Negative for chest pain, palpitations and leg swelling.  Gastrointestinal:  Negative for abdominal pain, blood in stool (No melena.), constipation and diarrhea.  Genitourinary:  Positive for menstrual problem (Period twice last month.). Negative for  dysuria and vaginal discharge.  Musculoskeletal:  Positive for arthralgias (symmetric knee, DIPs, MCPs, PIPs and wrists.  Sometimes with redness and swelling.).  Skin:  Negative for rash.  Neurological:  Negative for weakness and numbness.  Psychiatric/Behavioral:  Negative for dysphoric Hammond. The patient is not nervous/anxious.       Objective:   BP 111/67 (BP Location: Left Arm, Patient Position: Sitting, Cuff Size: Normal)   Pulse 64   Resp 14   Ht 5' 0.25" (1.53 m)   Wt 134 lb (60.8 kg)   LMP 09/08/2020 (Exact Date)   BMI 25.95 kg/m   Physical Exam HENT:     Head: Normocephalic and atraumatic.     Right Ear: Tympanic membrane, ear canal and external ear normal.     Left Ear: Tympanic membrane, ear canal and external ear normal.     Nose: Nose normal.     Mouth/Throat:     Mouth: Mucous membranes are moist.     Dentition: Dental caries present.     Pharynx: Oropharynx is clear.  Eyes:     Extraocular Movements: Extraocular movements intact.     Conjunctiva/sclera: Conjunctivae normal.     Pupils: Pupils are equal, round, and reactive to light.     Comments: Discs sharp bilaterally.  Neck:     Thyroid: No thyroid mass or thyromegaly.  Cardiovascular:     Rate and Rhythm: Normal rate and regular rhythm.     Heart sounds: S1 normal and S2 normal. No murmur heard.   No friction rub. No S3 or S4 sounds.     Comments: No carotid bruits.  Carotid, radial, femoral, DP and PT pulses normal and equal.    Pulmonary:     Effort: Pulmonary effort is normal.     Breath sounds: Normal breath sounds.  Chest:  Breasts:    Right: No inverted nipple, mass, nipple discharge, skin change or tenderness.     Left: No inverted nipple, mass, nipple discharge, skin change or tenderness.  Abdominal:     General: Bowel sounds are normal.     Palpations: Abdomen is soft. There is no hepatomegaly, splenomegaly or mass.     Tenderness: There is no abdominal tenderness.     Hernia: No hernia  is present.     Comments: Midline incisional scar to umbilicus.  Genitourinary:    Comments: Normal external female genitalia. No uterine or adnexal mass or tenderness.  Musculoskeletal:        General: Normal range of motion.     Cervical back: Normal range of motion and neck supple.     Right lower leg: No edema.  Left lower leg: No edema.     Comments: All PIPs of hands with hypertrophy though no synovial thickening or erythema.  Heberdon's nodes of 5th digit DIPs.  Lymphadenopathy:     Head:     Right side of head: No submental or submandibular adenopathy.     Left side of head: No submental or submandibular adenopathy.     Cervical: No cervical adenopathy.     Upper Body:     Right upper body: No supraclavicular or axillary adenopathy.     Left upper body: No supraclavicular or axillary adenopathy.     Lower Body: No right inguinal adenopathy. No left inguinal adenopathy.  Skin:    General: Skin is warm.     Capillary Refill: Capillary refill takes less than 2 seconds.     Findings: No rash.  Neurological:     General: No focal deficit present.     Mental Status: She is alert and oriented to person, place, and time.     Cranial Nerves: Cranial nerves are intact.     Sensory: Sensation is intact.     Motor: Motor function is intact.     Coordination: Coordination is intact.     Gait: Gait is intact.     Deep Tendon Reflexes: Reflexes are normal and symmetric.  Psychiatric:        Attention and Perception: Attention normal.        Hammond and Affect: Hammond normal.        Behavior: Behavior normal. Behavior is cooperative.        Thought Content: Thought content normal.     Assessment & Plan   1.  CPE without pap Guaiac cards x 3 to return in 2 weeks. Schedule mammogram Influenza vaccine Encouraged COVID vaccination and to rely on CDC information rather than social media. CBC, CMP  2.  Presbyopia: to try out reading glasses.  3.  Symmetric joint complaints:  exam  supports OA, but check sed rate, ANA, RF  4.  Hyperglycemia:  CMP  5.  Dental caries:  Dental referral.  5.  Hyperlipidemia:  FLP

## 2020-09-19 LAB — CBC WITH DIFFERENTIAL/PLATELET
Basophils Absolute: 0 10*3/uL (ref 0.0–0.2)
Basos: 0 %
EOS (ABSOLUTE): 0.2 10*3/uL (ref 0.0–0.4)
Eos: 4 %
Hematocrit: 40.6 % (ref 34.0–46.6)
Hemoglobin: 13.8 g/dL (ref 11.1–15.9)
Immature Grans (Abs): 0 10*3/uL (ref 0.0–0.1)
Immature Granulocytes: 0 %
Lymphocytes Absolute: 1.9 10*3/uL (ref 0.7–3.1)
Lymphs: 38 %
MCH: 31.4 pg (ref 26.6–33.0)
MCHC: 34 g/dL (ref 31.5–35.7)
MCV: 93 fL (ref 79–97)
Monocytes Absolute: 0.4 10*3/uL (ref 0.1–0.9)
Monocytes: 8 %
Neutrophils Absolute: 2.5 10*3/uL (ref 1.4–7.0)
Neutrophils: 50 %
Platelets: 251 10*3/uL (ref 150–450)
RBC: 4.39 x10E6/uL (ref 3.77–5.28)
RDW: 13 % (ref 11.7–15.4)
WBC: 4.9 10*3/uL (ref 3.4–10.8)

## 2020-09-19 LAB — COMPREHENSIVE METABOLIC PANEL
ALT: 19 IU/L (ref 0–32)
AST: 20 IU/L (ref 0–40)
Albumin/Globulin Ratio: 1.7 (ref 1.2–2.2)
Albumin: 4.7 g/dL (ref 3.8–4.8)
Alkaline Phosphatase: 62 IU/L (ref 44–121)
BUN/Creatinine Ratio: 14 (ref 9–23)
BUN: 9 mg/dL (ref 6–24)
Bilirubin Total: 0.3 mg/dL (ref 0.0–1.2)
CO2: 23 mmol/L (ref 20–29)
Calcium: 9.3 mg/dL (ref 8.7–10.2)
Chloride: 101 mmol/L (ref 96–106)
Creatinine, Ser: 0.66 mg/dL (ref 0.57–1.00)
GFR calc Af Amer: 123 mL/min/{1.73_m2} (ref >59)
GFR calc non Af Amer: 107 mL/min/{1.73_m2} (ref >59)
Globulin, Total: 2.8 g/dL (ref 1.5–4.5)
Glucose: 87 mg/dL (ref 65–99)
Potassium: 4.3 mmol/L (ref 3.5–5.2)
Sodium: 138 mmol/L (ref 134–144)
Total Protein: 7.5 g/dL (ref 6.0–8.5)

## 2020-09-19 LAB — LIPID PANEL W/O CHOL/HDL RATIO
Cholesterol, Total: 242 mg/dL — ABNORMAL HIGH (ref 100–199)
HDL: 60 mg/dL (ref >39)
LDL Chol Calc (NIH): 165 mg/dL — ABNORMAL HIGH (ref 0–99)
Triglycerides: 96 mg/dL (ref 0–149)
VLDL Cholesterol Cal: 17 mg/dL (ref 5–40)

## 2020-09-19 LAB — SEDIMENTATION RATE: Sed Rate: 3 mm/hr (ref 0–32)

## 2020-09-19 LAB — RHEUMATOID FACTOR: Rhuematoid fact SerPl-aCnc: <10 IU/mL (ref 0.0–13.9)

## 2020-09-19 LAB — ANA: Anti Nuclear Antibody (ANA): NEGATIVE

## 2020-11-02 ENCOUNTER — Telehealth: Payer: Self-pay | Admitting: Internal Medicine

## 2020-11-02 NOTE — Telephone Encounter (Signed)
Starting this morning patient left eye felt like she had something in it. Patient said that she tried to clean it and was

## 2020-11-02 NOTE — Telephone Encounter (Signed)
Patient called asking for advise. Patient stated since this morning her left eye was bothering her, she mentioned that felt like she got something in her eye. Patient mentioned that she rinse her eye with water, put some eye drops and chamomile drops but continues to get worse. Patient stated that her eye is now swollen and red and started to have a headache. Please advise.

## 2020-11-03 NOTE — Telephone Encounter (Signed)
Pt. Seen at [facility]

## 2020-12-12 ENCOUNTER — Telehealth: Payer: Self-pay | Admitting: Internal Medicine

## 2020-12-12 NOTE — Telephone Encounter (Signed)
Patient called requesting an appointment because she is been having irritation on her eyes. Patient stated that she has a burning sensation on her eyes and are irritated. Also, she stated that the skin around her eyes is red and irritated. Patient went to the Urgent Care on 11/02/2020 for a similar issue, but now is getting irritation on her skin as well and itches. Symptoms started about 3 weeks ago, but is not getting better. Patient works with chemicals. Please advise.

## 2020-12-14 NOTE — Telephone Encounter (Signed)
Try to get her in for an acute in the next week. In the meantime, is she wearing eye protection at work? Not just to keep any chemicals out of her eyes, but the fumes as well. Cool compresses to eyes Try Loratadine 10 mg daily and see if helps

## 2020-12-20 NOTE — Telephone Encounter (Signed)
Spoke with patient and notified her of Doctor recommendations. Appointment for patient was scheduled for 12/28/2020 at 12:30pm

## 2020-12-28 ENCOUNTER — Encounter: Payer: Self-pay | Admitting: Internal Medicine

## 2020-12-28 ENCOUNTER — Ambulatory Visit: Payer: Self-pay | Admitting: Internal Medicine

## 2020-12-28 ENCOUNTER — Other Ambulatory Visit: Payer: Self-pay

## 2020-12-28 VITALS — BP 117/71 | HR 68 | Resp 12 | Ht 60.0 in | Wt 134.5 lb

## 2020-12-28 DIAGNOSIS — Z889 Allergy status to unspecified drugs, medicaments and biological substances status: Secondary | ICD-10-CM

## 2020-12-28 MED ORDER — EPINEPHRINE 0.3 MG/0.3ML IJ SOAJ: 0.3000 mg | INTRAMUSCULAR | 1 refills | Status: AC | PRN

## 2020-12-28 NOTE — Progress Notes (Signed)
    Subjective:    Patient ID: Virginia Hammond, female   DOB: [birth year], 45 y.o.   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 Worst episode was 12/16/2020 where had tongue swelling and throat swelling.  Was not felt to have anaphylaxis at ED.   She was switched to another job due to her response to the resin exposure.   Yesterday, her boss asked her to try to work with a very small amount of the resin, and she started having redness and itching about her eyes and neck/chest area.   She is taking Claritin 10 mg daily, which she feels is helping. She has been standing in a lukewarm shower as well and allowing water to run over her face and chest.  Current Meds  Medication Sig   loratadine (CLARITIN) 10 MG tablet Take 10 mg by mouth daily.   No Known Allergies   Review of Systems    Objective:   BP 117/71 (BP Location: Left Arm, Patient Position: Sitting, Cuff Size: Normal)   Pulse 68   Resp 12   Ht 5' (1.524 m)   Wt 134 lb 8 oz (61 kg)   LMP 12/19/2020 (Exact Date)   BMI 26.27 kg/m   Physical Exam NAD HEENT:  Mild erythema and swelling about eyes and anterior neck.  HEENT:  PERRL, EOMI, conjunctivae with perhaps mild injection.  No watering.  TMs pearly gray, nasal mucosa without selling.  Throat without swelling or injection Neck:  Supple, No adenopathy Chest:  CTA CV:  RRR without murmur or rub.  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  Asked she be moved to a position without any exposure.    Epi pen Rx written if accidental exposure at work.

## 2021-02-26 ENCOUNTER — Other Ambulatory Visit: Payer: Self-pay | Admitting: Internal Medicine

## 2021-04-18 ENCOUNTER — Telehealth: Payer: Self-pay | Admitting: Internal Medicine

## 2021-04-18 NOTE — Telephone Encounter (Signed)
Patient called asking for an appointment. Patient stated that during the month of May she got her period twice. The second time she had it, she had intense cramping and it was was significantly heavy. and she continued to have spotting everyday. Please advise.

## 2021-04-19 NOTE — Telephone Encounter (Signed)
Schedule next available --can be an illness visit-and put on wait list

## 2021-04-19 NOTE — Telephone Encounter (Signed)
Called patient and scheduled an appointment for 04/26/2021.

## 2021-04-27 ENCOUNTER — Ambulatory Visit: Payer: Self-pay | Admitting: Internal Medicine

## 2021-04-27 ENCOUNTER — Encounter: Payer: Self-pay | Admitting: Internal Medicine

## 2021-04-27 ENCOUNTER — Other Ambulatory Visit: Payer: Self-pay

## 2021-04-27 VITALS — BP 110/90 | HR 68 | Resp 16 | Ht 60.0 in | Wt 131.0 lb

## 2021-04-27 DIAGNOSIS — N921 Excessive and frequent menstruation with irregular cycle: Secondary | ICD-10-CM

## 2021-04-27 DIAGNOSIS — F439 Reaction to severe stress, unspecified: Secondary | ICD-10-CM

## 2021-04-27 DIAGNOSIS — N939 Abnormal uterine and vaginal bleeding, unspecified: Secondary | ICD-10-CM

## 2021-04-27 NOTE — Progress Notes (Signed)
    Subjective:    Patient ID: Virginia Hammond, female   DOB: [birth year], 45 y.o.   MRN: [id number]   HPI  Daughter interprets  Had a regular period on May 9th through May 15th. Other than going a bit long, was no different that usual. No increased cramping or bleeding. Periods generally 3-4 days. Generally, fairly regular as well  Restarted again on May 26th. Did not have any preceding symptoms of low back pain--just started bleeding and by the second day was having heavy bleeding with clots and cramping as well as dizziness.  Continued through the 30th and then tapered over the last day. Stopped with blood flow on the 31st.   Occasionally, can feel a bit light headed.  She has not had symptoms that she will start another period yet this month (due)  No hot flashes or night sweats. No vaginal discharge No pelvic pain between periods.   History of removal of cervical polyp in 2020.  Current Meds  Medication Sig   EPINEPHrine 0.3 mg/0.3 mL IJ SOAJ injection Inject 0.3 mg into the muscle as needed for anaphylaxis.   loratadine (CLARITIN) 10 MG tablet Take 10 mg by mouth daily.   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 Neck:  Supple, No adenopathy, no thyromegaly Chest:  CTA CV:  RRR without murmur or rub.  Radial pulses normal and equal.  Palms with good coloring. Abd:  S, NT, No HSM or mass, + BS GU:  normal external female genitalia.  No vaginal discharge.  No vaginal or cervical lesions.  No uterine or adnexal mass  or tenderness.     Assessment & Plan   Abnormal uterine bleeding:  Discussed could be due to getting into perimenopausal period.  She will call if this recurs and will consider medroxyprogesterone and pelvic ultrasound.   She is also to start ferrous gluconate 324 mg once daily if she develops heavy menstrual flow.    2.  Stress:  daughter and patient bring this up at end of visit--her last of 3 brothers dying in [place] from muscular dystrophy and she is struggling with this.  Would be interested in working on stress relief with Danton Clap, LCSW.

## 2021-04-27 NOTE — Patient Instructions (Signed)
To call the clinic for Medroxyprogesterone 10 mg if has another episode of heavy bleeding Take Ferrous gluconate 324 mg once daily during days of heavy periods Drink lots of water.

## 2021-04-29 DIAGNOSIS — N939 Abnormal uterine and vaginal bleeding, unspecified: Secondary | ICD-10-CM | POA: Insufficient documentation

## 2021-04-29 DIAGNOSIS — F439 Reaction to severe stress, unspecified: Secondary | ICD-10-CM | POA: Insufficient documentation

## 2021-04-30 ENCOUNTER — Other Ambulatory Visit: Payer: Self-pay

## 2021-04-30 ENCOUNTER — Other Ambulatory Visit: Payer: Self-pay | Admitting: Internal Medicine

## 2021-04-30 DIAGNOSIS — E785 Hyperlipidemia, unspecified: Secondary | ICD-10-CM

## 2021-05-01 LAB — LIPID PANEL W/O CHOL/HDL RATIO
Cholesterol, Total: 190 mg/dL (ref 100–199)
HDL: 54 mg/dL (ref >39)
LDL Chol Calc (NIH): 116 mg/dL — ABNORMAL HIGH (ref 0–99)
Triglycerides: 113 mg/dL (ref 0–149)
VLDL Cholesterol Cal: 20 mg/dL (ref 5–40)

## 2021-05-02 ENCOUNTER — Telehealth: Payer: Self-pay | Admitting: Clinical

## 2021-05-02 NOTE — Telephone Encounter (Signed)
LCSW contacted patient as a follow up referral from PCP for counseling due to stress. Patient reports she is interested as she feels she does it need however time of schedule and distance was a barrier, accepted to do virtual. When discussing cost and policy, patient reports since she is the only one working and as she is helping her family with cost in [place] at this time unable to afford the self pay cost of $30 as she hasn't applied for [facility].  Patient declined to make appointment at the time of call. Patient reports possibly in a month. LCSW informed she can call anytime if needed to schedule, or if needs to find a lower cost placement to contact LCSW. Patient thanked LCSW for the outreach.

## 2021-06-27 NOTE — Progress Notes (Signed)
Pt.notified

## 2021-08-19 DIAGNOSIS — Z889 Allergy status to unspecified drugs, medicaments and biological substances status: Secondary | ICD-10-CM | POA: Insufficient documentation

## 2021-09-21 ENCOUNTER — Other Ambulatory Visit: Payer: Self-pay

## 2021-09-21 ENCOUNTER — Ambulatory Visit: Payer: Self-pay | Admitting: Internal Medicine

## 2021-09-21 ENCOUNTER — Encounter: Payer: Self-pay | Admitting: Internal Medicine

## 2021-09-21 VITALS — BP 100/60 | HR 64 | Resp 16 | Ht 60.5 in | Wt 128.0 lb

## 2021-09-21 DIAGNOSIS — Z820 Family history of epilepsy and other diseases of the nervous system: Secondary | ICD-10-CM

## 2021-09-21 DIAGNOSIS — Z1231 Encounter for screening mammogram for malignant neoplasm of breast: Secondary | ICD-10-CM

## 2021-09-21 DIAGNOSIS — G8929 Other chronic pain: Secondary | ICD-10-CM | POA: Insufficient documentation

## 2021-09-21 DIAGNOSIS — Z23 Encounter for immunization: Secondary | ICD-10-CM

## 2021-09-21 DIAGNOSIS — Z0000 Encounter for general adult medical examination without abnormal findings: Secondary | ICD-10-CM

## 2021-09-21 DIAGNOSIS — R102 Pelvic and perineal pain: Secondary | ICD-10-CM

## 2021-09-21 DIAGNOSIS — N949 Unspecified condition associated with female genital organs and menstrual cycle: Secondary | ICD-10-CM

## 2021-09-21 DIAGNOSIS — M25562 Pain in left knee: Secondary | ICD-10-CM | POA: Insufficient documentation

## 2021-09-21 MED ORDER — IBUPROFEN 200 MG PO TABS: ORAL_TABLET | ORAL | 0 refills | Status: DC

## 2021-09-21 NOTE — Progress Notes (Unsigned)
    Subjective:    Patient ID: Virginia Hammond, female   DOB: 1976/01/07, 45 y.o.   MRN: [id number]   HPI  CPE with pap  1.  Pap:  Last 11/19/2018 and normal.    2.  Mammogram:    11/19/2018 and normal.  No family history of breast cancer.   3.  Osteoprevention:  Drinks one cup milk daily.  Feels she is physically active every day--does different exercises.    4.  Guaiac Cards/FIT:  Last 08/03/2018 and negative for blood.  5.  Colonoscopy:  Never.  No family history of colon cancer.    6.  Immunizations:    7.  Glucose/Cholesterol :  Has had Modern COVID vaccines x 2.  No influenza vaccine yet this year.   Current Meds  Medication Sig   EPINEPHrine 0.3 mg/0.3 mL IJ SOAJ injection Inject 0.3 mg into the muscle as needed for anaphylaxis.   loratadine (CLARITIN) 10 MG tablet Take 10 mg by mouth daily.   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   Cardiovascular:  Negative for chest pain and leg swelling.  Musculoskeletal:        Bilateral knees hurting.  Problem for 3 weeks.  No history of acute injury.  Pain increased going up stairs, L >R.  Hurts to knee down.  Sometimes swollen, but no erythema. Tylenol helps a little bit, but has not tried NSAIDS.   Pain in thoracic back--pressure in mid back at mid scapular level.  No history of injury .   Marland Kitchen     Objective:   BP 100/60 (BP Location: Right Arm, Patient Position: Sitting, Cuff Size: Normal)   Pulse 64   Resp 16   Ht 5' 0.5" (1.537 m)   Wt 128 lb (58.1 kg)   LMP 09/01/2021   BMI 24.59 kg/m   Physical Exam HYpertrophic changes of PIP joints of all fingers.   Tender over entire thoracic spinous processes. Mild tenderness over medial and lateral collateral ligaments. 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

## 2021-10-05 ENCOUNTER — Other Ambulatory Visit: Payer: Self-pay

## 2021-10-25 ENCOUNTER — Other Ambulatory Visit: Payer: Self-pay

## 2021-10-25 DIAGNOSIS — Z1211 Encounter for screening for malignant neoplasm of colon: Secondary | ICD-10-CM

## 2021-10-25 LAB — POC FIT TEST STOOL: Fecal Occult Blood: NEGATIVE

## 2021-11-01 ENCOUNTER — Other Ambulatory Visit: Payer: Self-pay | Admitting: Obstetrics and Gynecology

## 2021-11-01 DIAGNOSIS — Z1231 Encounter for screening mammogram for malignant neoplasm of breast: Secondary | ICD-10-CM

## 2021-11-07 ENCOUNTER — Ambulatory Visit (HOSPITAL_BASED_OUTPATIENT_CLINIC_OR_DEPARTMENT_OTHER): Admission: RE | Admit: 2021-11-07 | Payer: Self-pay | Source: Ambulatory Visit

## 2021-12-25 ENCOUNTER — Ambulatory Visit: Payer: Self-pay | Admitting: *Deleted

## 2021-12-25 ENCOUNTER — Encounter (INDEPENDENT_AMBULATORY_CARE_PROVIDER_SITE_OTHER): Payer: Self-pay

## 2021-12-25 ENCOUNTER — Ambulatory Visit: Payer: Self-pay

## 2021-12-25 ENCOUNTER — Ambulatory Visit
Admission: RE | Admit: 2021-12-25 | Discharge: 2021-12-25 | Disposition: A | Discharge status: Home / Self Care | Payer: No Typology Code available for payment source | Source: Ambulatory Visit | Attending: Obstetrics and Gynecology | Admitting: Obstetrics and Gynecology

## 2021-12-25 ENCOUNTER — Other Ambulatory Visit: Payer: Self-pay

## 2021-12-25 VITALS — BP 110/82 | Wt 132.1 lb

## 2021-12-25 DIAGNOSIS — Z1231 Encounter for screening mammogram for malignant neoplasm of breast: Secondary | ICD-10-CM

## 2021-12-25 DIAGNOSIS — Z1239 Encounter for other screening for malignant neoplasm of breast: Secondary | ICD-10-CM

## 2021-12-25 NOTE — Progress Notes (Signed)
[name] is a 46 y.o. female who presents to [facility] clinic today with no complaints.    Pap Smear: Pap smear not completed today. Last Pap smear was 11/19/2018 at [facility] clinic and was normal with negative HPV. Per patient has no history of an abnormal Pap smear. Last Pap smear result is available in Epic.   Physical exam: Breasts Breasts symmetrical. No skin abnormalities bilateral breasts. No nipple retraction bilateral breasts. No nipple discharge bilateral breasts. No lymphadenopathy. No lumps palpated bilateral breasts. No complaints of pain or tenderness on exam.  MS DIGITAL SCREENING TOMO BILATERAL  Result Date: 11/19/2018 CLINICAL DATA:  Screening. EXAM: DIGITAL SCREENING BILATERAL MAMMOGRAM WITH TOMO AND CAD COMPARISON:  None. ACR Breast Density Category b: There are scattered areas of fibroglandular density. FINDINGS: There are no findings suspicious for malignancy. Images were processed with CAD. IMPRESSION: No mammographic evidence of malignancy. A result letter of this screening mammogram will be mailed directly to the patient. RECOMMENDATION: Screening mammogram in one year. (Code:SM-B-01Y) BI-RADS CATEGORY  1: Negative. Electronically Signed   By: Virginia Hammond M.D.   On: 11/19/2018 13:00        Pelvic/Bimanual Pap is not indicated today per BCCCP guidelines.   Smoking History: Patient has never smoked.   Patient Navigation: Patient education provided. Access to services provided for patient through [facility] program. Spanish interpreter Virginia Hammond from [facility] provided.   Colorectal Cancer Screening: Per patient has never had a colonoscopy completed. Patient completed a FIT Test 10/25/2021 that was negative and  the FOBT stools cards 08/03/2018 that were negative. No complaints today.    Breast and Cervical Cancer Risk Assessment: Patient does not have family history of breast cancer, known genetic mutations, or radiation treatment to the chest before age  45. Patient does not have history of cervical dysplasia, immunocompromised, or DES exposure in-utero.  Risk Assessment     Risk Scores       12/25/2021 11/19/2018   Last edited by: Virginia Hammond, CMA Virginia Infante H, LPN   5-year risk: 0.4 % 0.4 %   Lifetime risk: 4.8 % 5 %            A: BCCCP exam without pap smear No complaints.  P: Referred patient to the [facility] for a screening mammogram on mobile unit. Appointment scheduled Tuesday, December 25, 2021 at 1430.  Virginia Parish, RN 12/25/2021 1:22 PM

## 2021-12-25 NOTE — Patient Instructions (Signed)
Explained breast self awareness with Virginia Hammond. Patient did not need a Pap smear today due to last Pap smear and HPV typing was 11/19/2018. Let her know BCCCP will cover Pap smears and HPV typing every 5 years unless has a history of abnormal Pap smears. Referred patient to the [facility] for a screening mammogram on mobile unit. Appointment scheduled Tuesday, December 25, 2021 at 1430. Patient aware of appointment and will be there. Let patient know the Breast Center will follow up with her within the next couple weeks with results of her mammogram by letter or phone. Virginia Hammond verbalized understanding.  Virginia Hammond, Virginia Maser, RN 1:22 PM

## 2022-03-29 IMAGING — MG MM DIGITAL SCREENING BILAT W/ TOMO AND CAD
8 series · 9 of 24 positions shown · non-contrast
Comparison: Previous exam(s).

CLINICAL DATA: Screening.

EXAM:
DIGITAL SCREENING BILATERAL MAMMOGRAM WITH TOMOSYNTHESIS AND CAD
TECHNIQUE: Bilateral screening digital craniocaudal and mediolateral oblique
mammograms were obtained. Bilateral screening digital breast
tomosynthesis was performed. The images were evaluated with
computer-aided detection.

[L CC synth-2D]
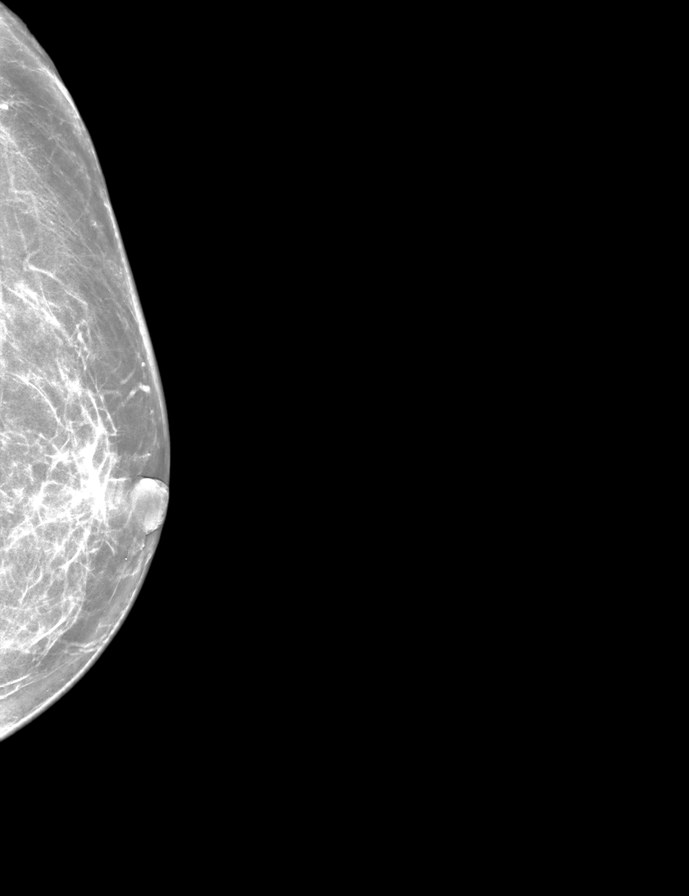

[R MLO synth-2D]
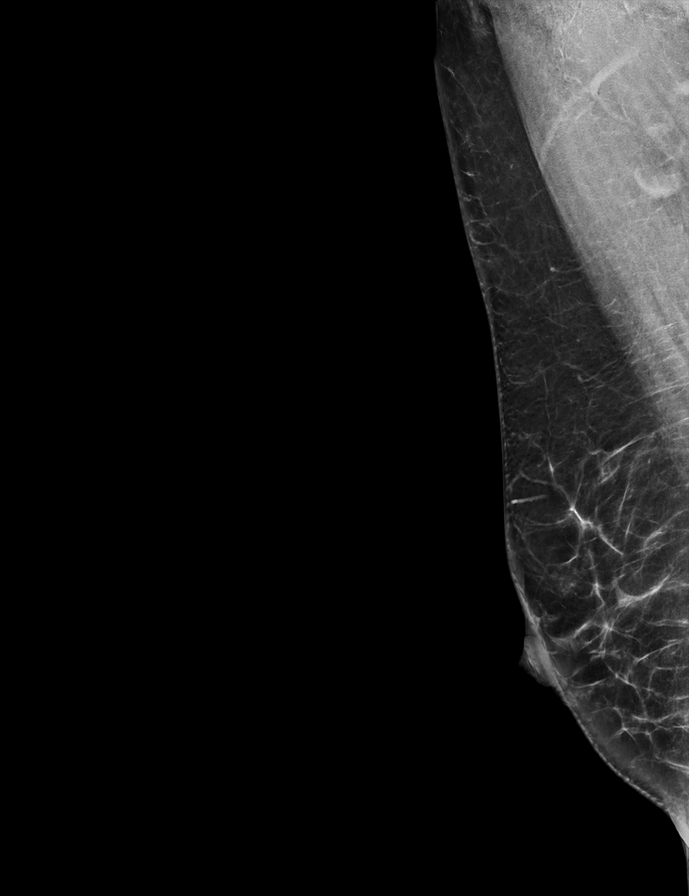

[R CC synth-2D]
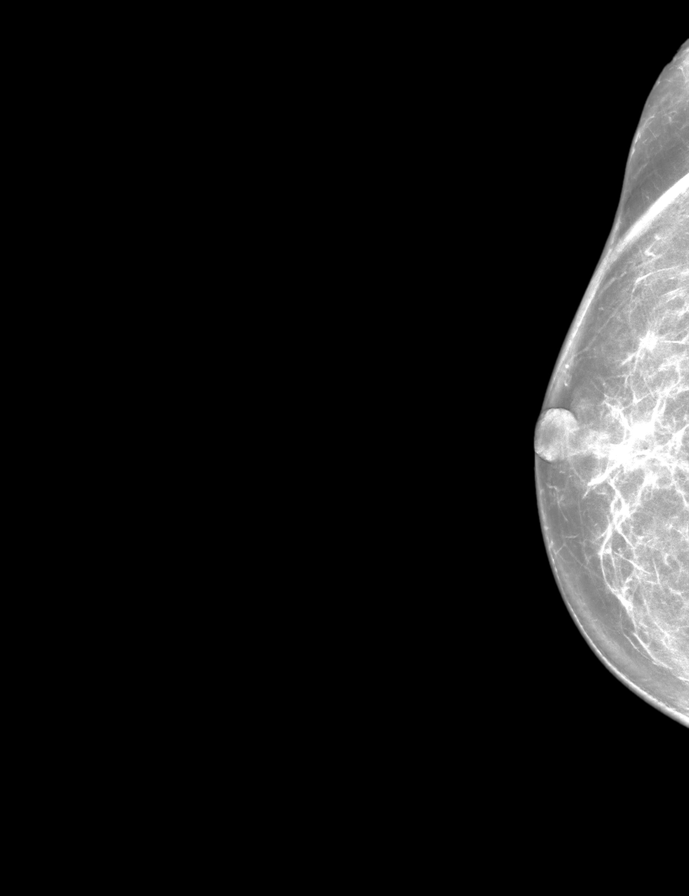

[L MLO synth-2D]
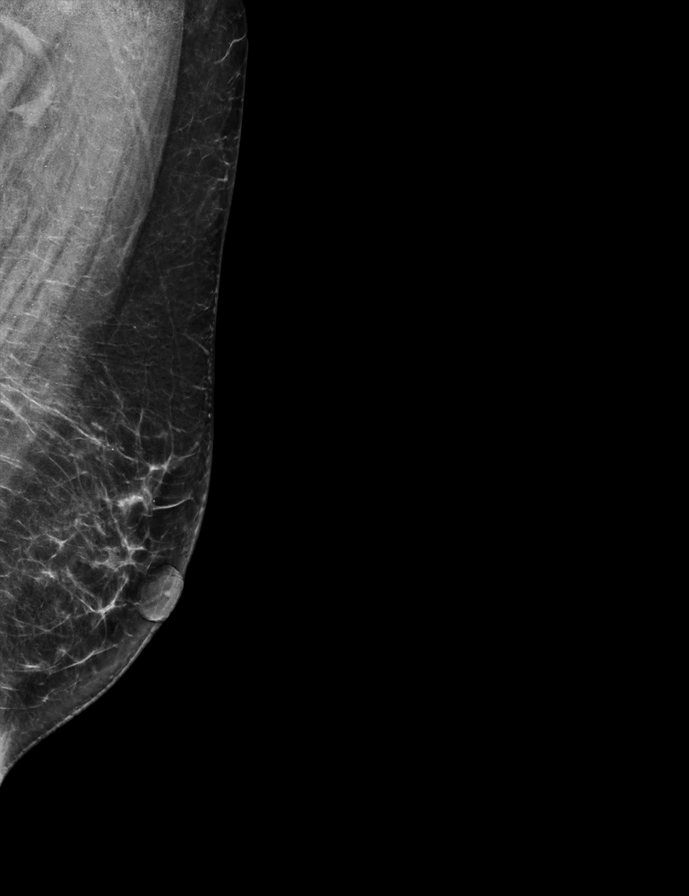

[R MLO tomo · 2 of 71 frames shown]
[frame 23/71]
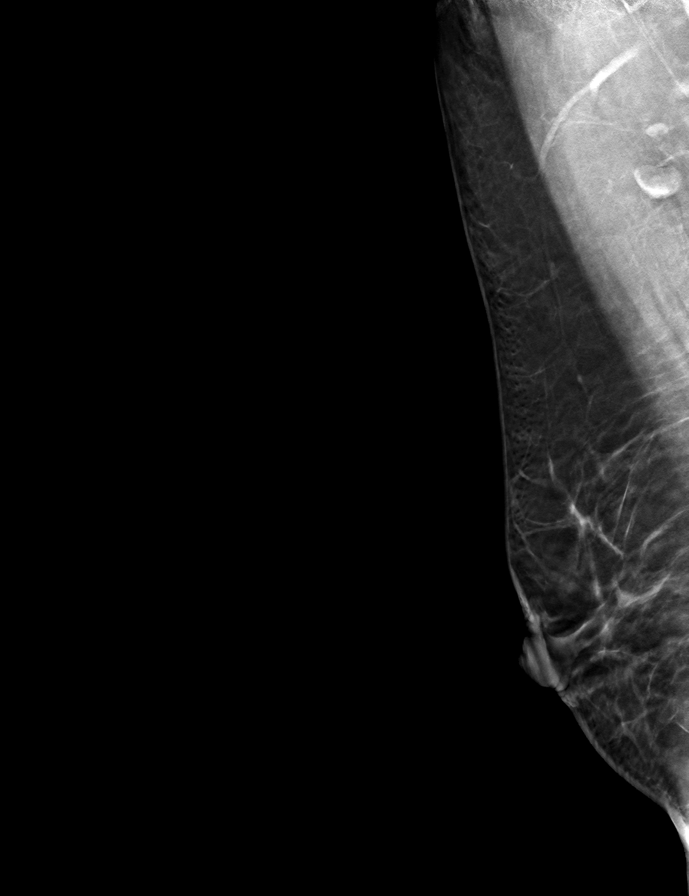
[frame 36/71]
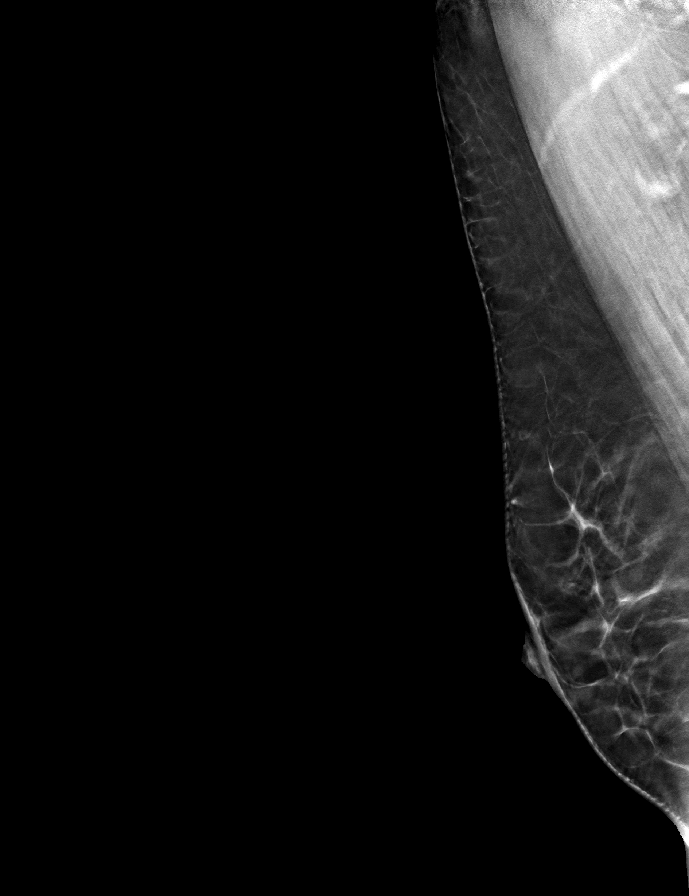

[L MLO tomo · tomo slice 35/68.0]
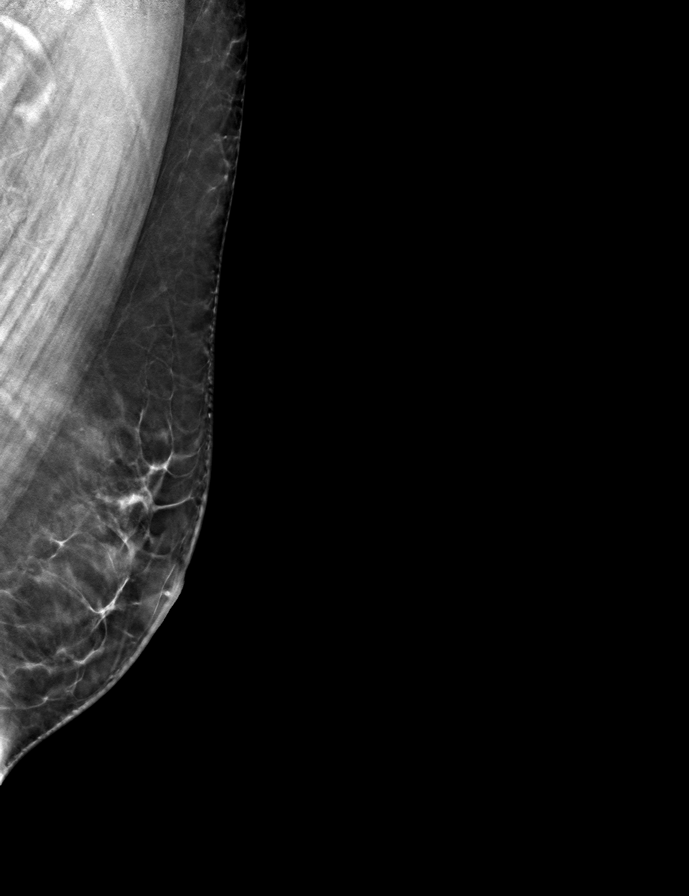

[R CC tomo · tomo slice 33/65.0]
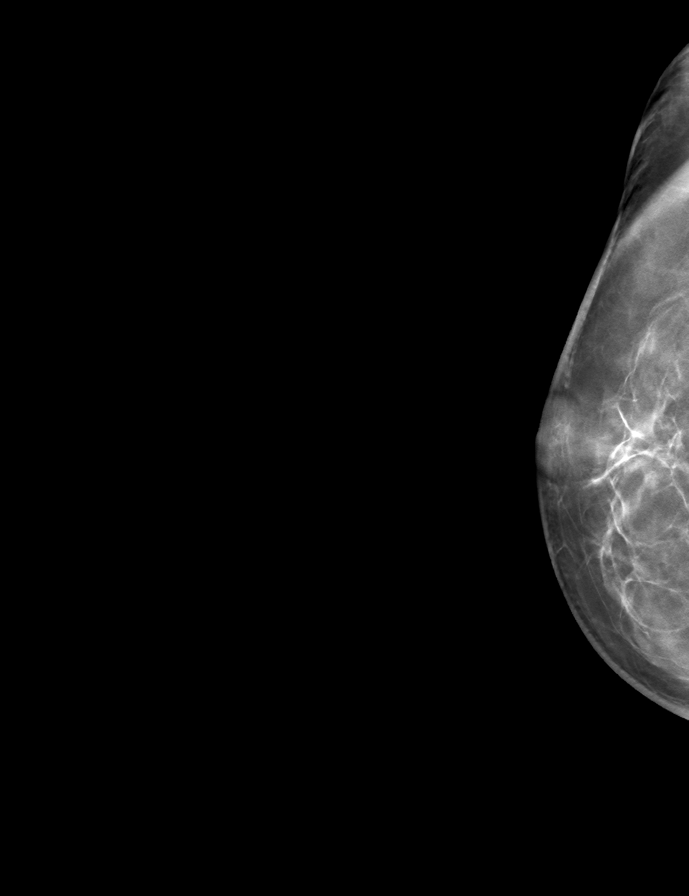

[L CC tomo · tomo slice 33/65.0]
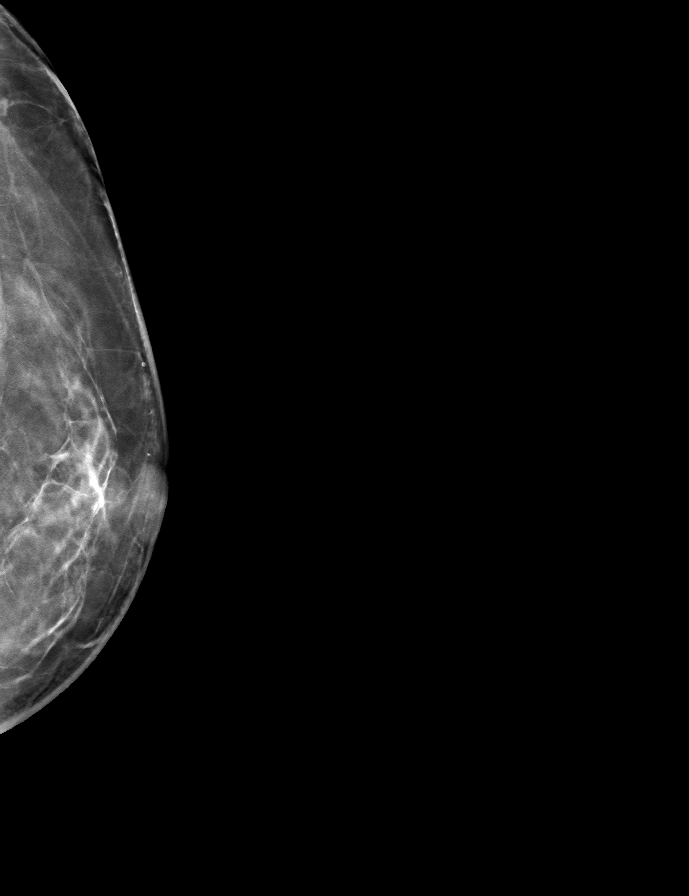

[9 of 24 positions shown; findings below may reference images not displayed]

ACR Breast Density Category b: There are scattered areas of
fibroglandular density.
FINDINGS: There are no findings suspicious for malignancy.
IMPRESSION: No mammographic evidence of malignancy. A result letter of this
screening mammogram will be mailed directly to the patient.

RECOMMENDATION:
Screening mammogram in one year. (Code:51-O-LD2)

BI-RADS CATEGORY  1: Negative.

## 2024-06-08 ENCOUNTER — Ambulatory Visit: Payer: Self-pay | Admitting: Internal Medicine

## 2024-08-23 ENCOUNTER — Telehealth: Payer: Self-pay | Admitting: Internal Medicine

## 2024-08-23 NOTE — Telephone Encounter (Signed)
 Patient would like sooner appointment than her scheduled upcoming appointment,   Patient states she is having stomach pain, no appetite and is also having constipation.   Patient is available any day at any time , we will call patient when there is a cancellation.

## 2024-09-02 ENCOUNTER — Ambulatory Visit: Admitting: Internal Medicine

## 2024-09-02 ENCOUNTER — Encounter: Payer: Self-pay | Admitting: Internal Medicine

## 2024-09-02 VITALS — BP 120/80 | HR 66 | Resp 16 | Wt 136.0 lb

## 2024-09-02 DIAGNOSIS — H9311 Tinnitus, right ear: Secondary | ICD-10-CM

## 2024-09-02 DIAGNOSIS — Z23 Encounter for immunization: Secondary | ICD-10-CM

## 2024-09-02 DIAGNOSIS — R1012 Left upper quadrant pain: Secondary | ICD-10-CM

## 2024-09-02 MED ORDER — FLUTICASONE PROPIONATE 50 MCG/ACT NA SUSP: NASAL | Status: AC

## 2024-09-02 MED ORDER — FAMOTIDINE 20 MG PO TABS: 20.0000 mg | ORAL_TABLET | Freq: Two times a day (BID) | ORAL | 1 refills | Status: AC

## 2024-09-02 NOTE — Telephone Encounter (Signed)
 Patient has been scheduled

## 2024-09-02 NOTE — Progress Notes (Unsigned)
    Subjective:    Patient ID: Virginia Hammond, female   DOB: [birth year], 48 y.o.   MRN: [id number]   HPI  Erminio Bloomer interprets   LUQ pain for 2 weeks prior to being evaluated:  was seen for this in ED at [facility] on 08/24/2024.  CBC, CMP, Lipase and CT scan of abdomen and pelvis were normal.  Was felt to have gastritis and constipation.  Treated with Famotidine 20 mg twice daily with resolution of pain after 4th day of treatment.  Unable to find length of treatment with the prescription.  She believes she was given about 20 days of treatment.   No melena or hematochezia.    2.  Right sided noise as if her heart is beating in her ear.  Her left ear just itches.  Pounding in ear started 1 week ago.  Notes the pounding when lying on her right side.  Keeps her up at night.  Symptoms nightly.   She believes she has decreased hearing on left side but not the right.   Has had episodes of vertigo.  Generally, occurs every other day.  Last about 1 minute.  Turning her head to either side is generally the trigger.  Started about 6 years ago with a more severe episode.  Reportedly seen in ED as felt very severe.  No recent congestion or cough.   Unable to find her history of vertigo in chart--reportedly seen at [facility].  Was told it was ear related.   + sneezing and mild congestion at times Not using claritin  Current Meds  Medication Sig   famotidine (PEPCID) 20 MG tablet Take 20 mg by mouth 2 (two) times daily. Started in a hospital in [place], [facility] for epigstrci and LUQ pain for 2 weeks ago.; helped   simethicone (MYLICON) 80 MG chewable tablet Chew 80 mg by mouth daily as needed for flatulence.   [DISCONTINUED] simethicone (MYLICON) 80 MG chewable tablet Chew 20 mg by mouth every 6 (six) hours as needed for flatulence.   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   Assessment & Plan

## 2024-09-23 ENCOUNTER — Ambulatory Visit: Payer: Self-pay | Admitting: Internal Medicine

## 2024-12-15 ENCOUNTER — Encounter: Payer: Self-pay | Admitting: Internal Medicine

## 2024-12-15 ENCOUNTER — Telehealth: Payer: Self-pay | Admitting: Internal Medicine

## 2024-12-15 ENCOUNTER — Ambulatory Visit: Payer: Self-pay | Admitting: Internal Medicine

## 2024-12-15 VITALS — BP 120/70 | HR 66 | Resp 19 | Ht 60.0 in | Wt 135.0 lb

## 2024-12-15 DIAGNOSIS — N921 Excessive and frequent menstruation with irregular cycle: Secondary | ICD-10-CM

## 2024-12-15 DIAGNOSIS — N951 Menopausal and female climacteric states: Secondary | ICD-10-CM

## 2024-12-15 MED ORDER — IRON (FERROUS SULFATE) 325 (65 FE) MG PO TABS: ORAL_TABLET | ORAL | Status: AC

## 2024-12-15 MED ORDER — MEDROXYPROGESTERONE ACETATE 10 MG PO TABS: ORAL_TABLET | ORAL | 0 refills | Status: AC

## 2024-12-15 NOTE — Telephone Encounter (Signed)
 Patient would like to be seen for patient states she had not had a menstrual cycle for four months , and she started a cycle 8 days ago , patient states she started with light bleeding ,but then she has been having heavy period for past days  with blood clots .   Patient also states she feels dizzy , and weak in the mornings.   We will call patient when there is a cancellation,   Confirm with patient due to inclement weather if she is able to come this weeks if there is a cancellation, patient states she is available , patient just asked to be called at least 30 minutes before appointment.

## 2024-12-15 NOTE — Progress Notes (Unsigned)
" ° ° ° °  Subjective:    Patient ID: Avelina Viona Code, female   DOB: 1975/12/25, 49 y.o.   MRN: [id number]   HPI  Erminio Bloomer interprets   Menorrhagia with period starting Jan 17th.  States started off with just spotting, then on the 22nd, became heavy and with clots.  Went 4 months without a period prior.   No cramping, just feels dizzy.   Legs feel week.   At times, feels she could pass out.  Just feels very weak. She is eating and drinking fine.   Started having hot flashes and night sweats in November and December.   States those symptoms decreased beginning of January.     Current Meds  Medication Sig   famotidine  (PEPCID ) 20 MG tablet Take 1 tablet (20 mg total) by mouth 2 (two) times daily.   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

## 2024-12-15 NOTE — Patient Instructions (Signed)
 Call report to office in 2 days.

## 2024-12-15 NOTE — Telephone Encounter (Signed)
 Patient has been scheduled for 12/15/24.

## 2024-12-16 ENCOUNTER — Ambulatory Visit: Payer: Self-pay | Admitting: Internal Medicine

## 2024-12-16 LAB — CBC WITH DIFFERENTIAL/PLATELET
Basophils Absolute: 0 10*3/uL (ref 0.0–0.2)
Basos: 0 %
EOS (ABSOLUTE): 0.2 10*3/uL (ref 0.0–0.4)
Eos: 4 %
Hematocrit: 39.9 % (ref 34.0–46.6)
Hemoglobin: 13 g/dL (ref 11.1–15.9)
Immature Grans (Abs): 0 10*3/uL (ref 0.0–0.1)
Immature Granulocytes: 0 %
Lymphocytes Absolute: 1.7 10*3/uL (ref 0.7–3.1)
Lymphs: 37 %
MCH: 30.4 pg (ref 26.6–33.0)
MCHC: 32.6 g/dL (ref 31.5–35.7)
MCV: 93 fL (ref 79–97)
Monocytes Absolute: 0.3 10*3/uL (ref 0.1–0.9)
Monocytes: 7 %
Neutrophils Absolute: 2.4 10*3/uL (ref 1.4–7.0)
Neutrophils: 52 %
Platelets: 271 10*3/uL (ref 150–450)
RBC: 4.27 x10E6/uL (ref 3.77–5.28)
RDW: 14.1 % (ref 11.7–15.4)
WBC: 4.6 10*3/uL (ref 3.4–10.8)

## 2025-04-08 ENCOUNTER — Other Ambulatory Visit: Payer: Self-pay

## 2025-04-20 ENCOUNTER — Encounter: Payer: Self-pay | Admitting: Internal Medicine
